# Patient Record
Sex: Female | Born: 1970 | Hispanic: Yes | Marital: Single
Health system: Southern US, Community
[De-identification: ages and names within clinical notes are randomized; demographics above are authoritative.]

## PROBLEM LIST (undated history)

## (undated) HISTORY — PX: CHOLECYSTECTOMY: SHX55

---

## 2007-07-25 ENCOUNTER — Emergency Department: Payer: Self-pay | Admitting: Emergency Medicine

## 2012-11-25 ENCOUNTER — Emergency Department: Payer: Self-pay | Admitting: Emergency Medicine
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
## 2016-07-21 ENCOUNTER — Emergency Department
Admission: EM | Admit: 2016-07-21 | Discharge: 2016-07-21 | Disposition: A | Discharge status: Home / Self Care | Payer: Self-pay | Attending: Emergency Medicine | Admitting: Emergency Medicine

## 2016-07-21 ENCOUNTER — Encounter: Payer: Self-pay | Admitting: Emergency Medicine

## 2016-07-21 DIAGNOSIS — H1131 Conjunctival hemorrhage, right eye: Secondary | ICD-10-CM

## 2016-07-21 MED ORDER — FLUORESCEIN SODIUM 1 MG OP STRP
ORAL_STRIP | OPHTHALMIC | Status: AC
  Filled 2016-07-21: qty 1

## 2016-07-21 NOTE — ED Notes (Signed)
Discharge instructions reviewed with patient. Patient verbalized understanding. Patient ambulated to lobby without difficulty.   

## 2016-07-21 NOTE — ED Triage Notes (Addendum)
Pt reports right eye pain that started last night around 8:30 and has gotten progressively worse. States her peripheral vision on the right side is beginning to get worse. Reports minor itching. Right eye red in triage. Pt denies injury or foreign body. Denies drainage.

## 2016-07-22 NOTE — ED Provider Notes (Signed)
[facility] Emergency Department Provider Note ____________________________________________  Time seen: 2236  I have reviewed the triage vital signs and the nursing notes.  HISTORY  Chief Complaint  Eye Pain  HPI Shari Knight is a 45 y.o. female presents to the ED for evaluation of redness to the right eye without particular injury or accident. Patient describes being told by her daughter that her eye was red 1 day prior. By the next day, the patient noted the redness had increased in area covering about half of the thigh, over the conjunctiva. The patient denies any visual disturbance, eye pain, discharge, or foreign body sensation. She also denies any recent injury, accident, trauma, or straining. The patient is not were glasses or contacts and has never had a routine eye exam.  History reviewed. No pertinent past medical history.  There are no active problems to display for this patient.  Past Surgical History:  Procedure Laterality Date  . CHOLECYSTECTOMY      Prior to Admission medications   Not on File    Allergies Review of patient's allergies indicates no known allergies.  No family history on file.  Social History Social History  Substance Use Topics  . Smoking status: Not on file  . Smokeless tobacco: Not on file  . Alcohol use Not on file    Review of Systems  Constitutional: Negative for fever. Eyes: Negative for visual changes. Right eye redness as above.  ENT: Negative for sore throat. Cardiovascular: Negative for chest pain. Respiratory: Negative for shortness of breath. Gastrointestinal: Negative for abdominal pain, vomiting and diarrhea. Neurological: Negative for headaches, focal weakness or numbness. ____________________________________________  PHYSICAL EXAM:  VITAL SIGNS: ED Triage Vitals [07/21/16 2213]  Enc Vitals Group     BP 135/84     Pulse Rate 64     Resp 16     Temp 98 F (36.7 C)     Temp  Source Oral     SpO2 99 %     Weight 160 lb (72.6 kg)     Height 5\' 2"  (1.575 m)     Head Circumference      Peak Flow      Pain Score 6     Pain Loc      Pain Edu?      Excl. in GC?    Constitutional: Alert and oriented. Well appearing and in no distress. Head: Normocephalic and atraumatic. Eyes: Conjunctivae are normal on the left. The right eye with a moderate subconjunctival hemorrhage from 7 o'clock to 12 o'clock. No fluorescein dye uptake noted. No globe trauma. No gross foreign body noted.  PERRL. Normal fundi bilaterally. Normal extraocular movements Hematological/Lymphatic/Immunological: No preauricular lymphadenopathy. Respiratory: Normal respiratory effort. Neurologic:  Normal gait without ataxia. Normal speech and language. No gross focal neurologic deficits are appreciated. Skin:  Skin is warm, dry and intact. No rash noted. Psychiatric: Mood and affect are normal. Patient exhibits appropriate insight and judgment. ____________________________________________  PROCEDURES  Tetracaine i gtt OD ____________________________________________  INITIAL IMPRESSION / ASSESSMENT AND PLAN / ED COURSE  Patient with an acute subconjunctival hemorrhage of the right eye without known injury, accident, increase impression at this time. Patient's exam is otherwise benign is no reported eye pain, vision loss, or nausea. Patient is reassured about the self-limited nature of a subconjunctival hemorrhage. She is discharged with return follow-up instructions as well as a referral to [facility] for ongoing evaluation as needed.  Clinical Course   ____________________________________________  FINAL CLINICAL IMPRESSION(S) / ED DIAGNOSES  Final diagnoses:  Conjunctival hemorrhage, right eye      Lissa HoardJenise V Bacon Ciearra Rufo, PA-C 07/22/16 2313    Myrna Blazeravid Matthew Schaevitz, MD 07/22/16 [phone]
